# Patient Record
Sex: Female | Born: 1960 | Race: Black or African American | Hispanic: No | Marital: Married | Smoking: Former smoker
Health system: Southern US, Community
[De-identification: ages and names within clinical notes are randomized; demographics above are authoritative.]

---

## 2021-12-29 ENCOUNTER — Emergency Department: Payer: Medicare Other

## 2021-12-29 ENCOUNTER — Other Ambulatory Visit: Payer: Self-pay

## 2021-12-29 ENCOUNTER — Encounter: Payer: Self-pay | Admitting: Emergency Medicine

## 2021-12-29 ENCOUNTER — Emergency Department
Admission: EM | Admit: 2021-12-29 | Discharge: 2021-12-29 | Disposition: A | Discharge status: Home / Self Care | Payer: Medicare Other | Attending: Student in an Organized Health Care Education/Training Program | Admitting: Student in an Organized Health Care Education/Training Program

## 2021-12-29 DIAGNOSIS — E039 Hypothyroidism, unspecified: Secondary | ICD-10-CM | POA: Diagnosis not present

## 2021-12-29 DIAGNOSIS — W228XXA Striking against or struck by other objects, initial encounter: Secondary | ICD-10-CM | POA: Insufficient documentation

## 2021-12-29 DIAGNOSIS — Z23 Encounter for immunization: Secondary | ICD-10-CM | POA: Insufficient documentation

## 2021-12-29 DIAGNOSIS — S81812A Laceration without foreign body, left lower leg, initial encounter: Secondary | ICD-10-CM | POA: Diagnosis not present

## 2021-12-29 DIAGNOSIS — S8992XA Unspecified injury of left lower leg, initial encounter: Secondary | ICD-10-CM | POA: Diagnosis present

## 2021-12-29 MED ORDER — TETANUS-DIPHTH-ACELL PERTUSSIS 5-2.5-18.5 LF-MCG/0.5 IM SUSY
0.5000 mL | PREFILLED_SYRINGE | Freq: Once | INTRAMUSCULAR | Status: AC
  Administered 2021-12-29: 0.5 mL via INTRAMUSCULAR
  Filled 2021-12-29: qty 0.5

## 2021-12-29 MED ORDER — LIDOCAINE-EPINEPHRINE (PF) 2 %-1:200000 IJ SOLN
20.0000 mL | Freq: Once | INTRAMUSCULAR | Status: AC
  Administered 2021-12-29: 20 mL
  Filled 2021-12-29: qty 20

## 2021-12-29 MED ORDER — CEPHALEXIN 500 MG PO CAPS: 500.0000 mg | ORAL_CAPSULE | Freq: Three times a day (TID) | ORAL | 0 refills | Status: AC

## 2021-12-29 MED ORDER — OXYCODONE HCL 5 MG PO TABS
5.0000 mg | ORAL_TABLET | Freq: Once | ORAL | Status: AC
  Administered 2021-12-29: 5 mg via ORAL
  Filled 2021-12-29: qty 1

## 2021-12-29 MED ORDER — BACITRACIN ZINC 500 UNIT/GM EX OINT
TOPICAL_OINTMENT | Freq: Once | CUTANEOUS | Status: AC
  Filled 2021-12-29: qty 1.8

## 2021-12-29 NOTE — Discharge Instructions (Addendum)
-  Take all of antibiotics as prescribed. ?-Take Tylenol/ibuprofen as needed for pain. ?-Return to the emergency department or to your primary care provider for suture removal in 7 to 10 days. ?-Keep the adhesive bandages on for least 48 hours.  After that you may remove them, and wash in the shower with soap and water.  Be careful to avoid generous activity that may undo the sutures. ?-Return to the emergency department anytime if you begin to experience any new or worsening symptoms. ?

## 2021-12-29 NOTE — ED Triage Notes (Signed)
Presents via EMS from [facility] with laceration to left lower leg  per EMS the staff was transferring pt to w/c  and hit her leg  ?

## 2021-12-29 NOTE — ED Provider Notes (Signed)
? ?[facility] ?Provider Note ? ? ? Event Date/Time  ? First MD Initiated Contact with Patient 12/29/21 1311   ?  (approximate) ? ? ?History  ? ?Chief Complaint ?Laceration (/) ? ? ?HPI ?Maria Hubbard is a 61 y.o. female, history of anemia, depression, hypothyroidism, presents to the emergency department for evaluation of leg injury.  Patient states that the patient is being transferred to a different bed when her leg accidentally hit something, causing a large laceration.  She is currently endorsing significant leg pain.  Denies any other injuries.  Denies numbness/tingling in her lower extremities.  No recent illnesses.  Denies fever/chills, knee pain, hip pain, foot pain, chest pain, shortness of breath, lightheadedness, or dizziness. ? ?History Limitations: No limitations. ? ?  ? ? ?Physical Exam  ?Triage Vital Signs: ?ED Triage Vitals  ?Enc Vitals Group  ?   BP 12/29/21 1302 110/85  ?   Pulse Rate 12/29/21 1302 91  ?   Resp 12/29/21 1302 16  ?   Temp 12/29/21 1302 98.4 ?F (36.9 ?C)  ?   Temp Source 12/29/21 1302 Oral  ?   SpO2 12/29/21 1302 98 %  ?   Weight 12/29/21 1303 130 lb (59 kg)  ?   Height 12/29/21 1303 5' 5.5" (1.664 m)  ?   Head Circumference --   ?   Peak Flow --   ?   Pain Score 12/29/21 1303 4  ?   Pain Loc --   ?   Pain Edu? --   ?   Excl. in GC? --   ? ? ?Most recent vital signs: ?Vitals:  ? 12/29/21 1535 12/29/21 1805  ?BP: 122/76 121/73  ?Pulse: 88 80  ?Resp: 16 16  ?Temp:    ?SpO2: 98% 98%  ? ? ?General: Awake, NAD.  ?Skin: Warm, dry.  ?CV: Good peripheral perfusion.  ?Resp: Normal effort.  ?Abd: Soft, non-tender. No distention.  ?Neuro: At baseline. No gross neurological deficits.  ?Other: 15 cm vertical laceration extending from the proximal tibia towards the distal tibia, triangle shaped pattern at the top, as well as a connecting horizontal, 4 centimeter laceration along the proximal tibia.  Normal range of motion of the left lower extremity.  Pulse, motor,  sensation intact.  ? ?Physical Exam ? ? ? ?ED Results / Procedures / Treatments  ?Labs ?(all labs ordered are listed, but only abnormal results are displayed) ?Labs Reviewed - No data to display ? ? ?EKG ?Not applicable. ? ? ?RADIOLOGY ? ?ED Provider Interpretation: I personally reviewed and interpreted this x-ray.  No evidence of fracture or dislocation. ? ?DG Tibia/Fibula Left ? ?Result Date: 12/29/2021 ?CLINICAL DATA:  Trauma, pain EXAM: LEFT TIBIA AND FIBULA - 2 VIEW COMPARISON:  None. FINDINGS: No recent fracture or dislocation is seen. Small smooth marginated calcification adjacent to the tip of medial malleolus may suggest old avulsion. Small bony spurs seen in the left ankle and left knee. There is soft tissue swelling over the ankle more so on the medial side. IMPRESSION: No recent fracture or dislocation is seen in the left tibia and fibula. Electronically Signed   By: Ernie Avena M.D.   On: 12/29/2021 15:04   ? ?PROCEDURES: ? ?Critical Care performed: None. ? ?Marland Kitchen.Laceration Repair ? ?Date/Time: 12/29/2021 8:23 PM ?Performed by: Varney Daily, PA ?Authorized by: Varney Daily, PA  ? ?Consent:  ?  Consent obtained:  Verbal ?  Consent given by:  Patient ?  Risks  discussed:  Infection and pain ?  Alternatives discussed:  No treatment ?Universal protocol:  ?  Patient identity confirmed:  Verbally with patient ?Anesthesia:  ?  Anesthesia method:  Local infiltration ?  Local anesthetic:  Lidocaine 2% WITH epi ?Laceration details:  ?  Location:  Leg ?  Leg location:  L lower leg ?  Length (cm):  15 ?  Depth (mm):  3 ?Pre-procedure details:  ?  Preparation:  Patient was prepped and draped in usual sterile fashion and imaging obtained to evaluate for foreign bodies ?Exploration:  ?  Hemostasis achieved with:  Direct pressure ?  Imaging obtained: x-ray   ?  Imaging outcome: foreign body not noted   ?  Wound exploration: wound explored through full range of motion and entire depth of wound  visualized   ?  Wound extent: no foreign bodies/material noted, no muscle damage noted, no nerve damage noted, no tendon damage noted, no underlying fracture noted and no vascular damage noted   ?Treatment:  ?  Area cleansed with:  Saline ?  Amount of cleaning:  Standard ?  Irrigation solution:  Sterile saline ?  Irrigation volume:  1000 ml ?  Irrigation method:  Pressure wash ?Skin repair:  ?  Repair method:  Sutures ?  Suture size:  4-0 ?  Suture material:  Nylon ?  Suture technique:  Running locked ?  Number of sutures:  6 ?Approximation:  ?  Approximation:  Close ?Post-procedure details:  ?  Dressing:  Sterile dressing, tube gauze, antibiotic ointment and adhesive bandage ?  Procedure completion:  Tolerated well, no immediate complications ? ? ? ?MEDICATIONS ORDERED IN ED: ?Medications  ?oxyCODONE (Oxy IR/ROXICODONE) immediate release tablet 5 mg (5 mg Oral Given 12/29/21 1437)  ?lidocaine-EPINEPHrine (XYLOCAINE W/EPI) 2 %-1:200000 (PF) injection 20 mL (20 mLs Infiltration Given 12/29/21 1437)  ?Tdap (BOOSTRIX) injection 0.5 mL (0.5 mLs Intramuscular Given 12/29/21 1437)  ?bacitracin ointment ( Topical Given 12/29/21 1757)  ? ? ? ?IMPRESSION / MDM / ASSESSMENT AND PLAN / ED COURSE  ?I reviewed the triage vital signs and the nursing notes. ?             ?               ? ?Differential diagnosis includes, but is not limited to, tibia/fibula fracture, laceration ? ?ED Course ?Patient appears well.  Vital signs within normal limits.  Currently endorsing significant pain in her left lower extremity.  We will go ahead treat with small dose of oxycodone. ? ?Laceration was anesthetized with 2% lidocaine with epinephrine.  Cleansed thoroughly with normal saline.  Repaired utilizing 4-0 sutures, 3 running locking, followed by 3 simple interrupted.  Wound was then covered with bacitracin, reinforced by adhesive bandages, and dressed with sterile gauze.  See above for details. ? ?Assessment/Plan ?Patient presents with  laceration to the left lower extremity.  Laceration was repaired successfully with no immediate complications.  X-ray reassuring for no evidence of underlying fracture.  Provided tetanus booster.  We will plan to discharge this patient with a prescription for cephalexin.  Advised her to return the emergency department or see her primary care provider for suture removal in 7 to 10 days. ? ?Patient was provided with anticipatory guidance, return precautions, and educational material. Encouraged the patient to return to the emergency department at any time if they begin to experience any new or worsening symptoms.  ? ?  ? ? ?FINAL CLINICAL IMPRESSION(S) / ED DIAGNOSES  ? ?  Final diagnoses:  ?Laceration of left lower extremity, initial encounter  ? ? ? ?Rx / DC Orders  ? ?ED Discharge Orders   ? ?      Ordered  ?  cephALEXin (KEFLEX) 500 MG capsule  3 times daily       ? 12/29/21 1735  ? ?  ?  ? ?  ? ? ? ?Note:  This document was prepared using Dragon voice recognition software and may include unintentional dictation errors. ?  ?Varney DailySimpson, Madeliene Tejera Lee, [place] ?12/29/21 2028 ? ?  ?Willy Eddyobinson, Patrick, MD ?12/30/21 [phone] ? ?

## 2021-12-29 NOTE — ED Notes (Signed)
Left lower leg wound cleaned and dressed with bactracin and conform  tolerated well ?

## 2023-02-24 IMAGING — DX DG TIBIA/FIBULA 2V*L*
3 series · 3 of 3 positions shown · non-contrast
Comparison: None.

CLINICAL DATA: Trauma, pain

EXAM:
LEFT TIBIA AND FIBULA - 2 VIEW

[tibia ap (1 of 2)]
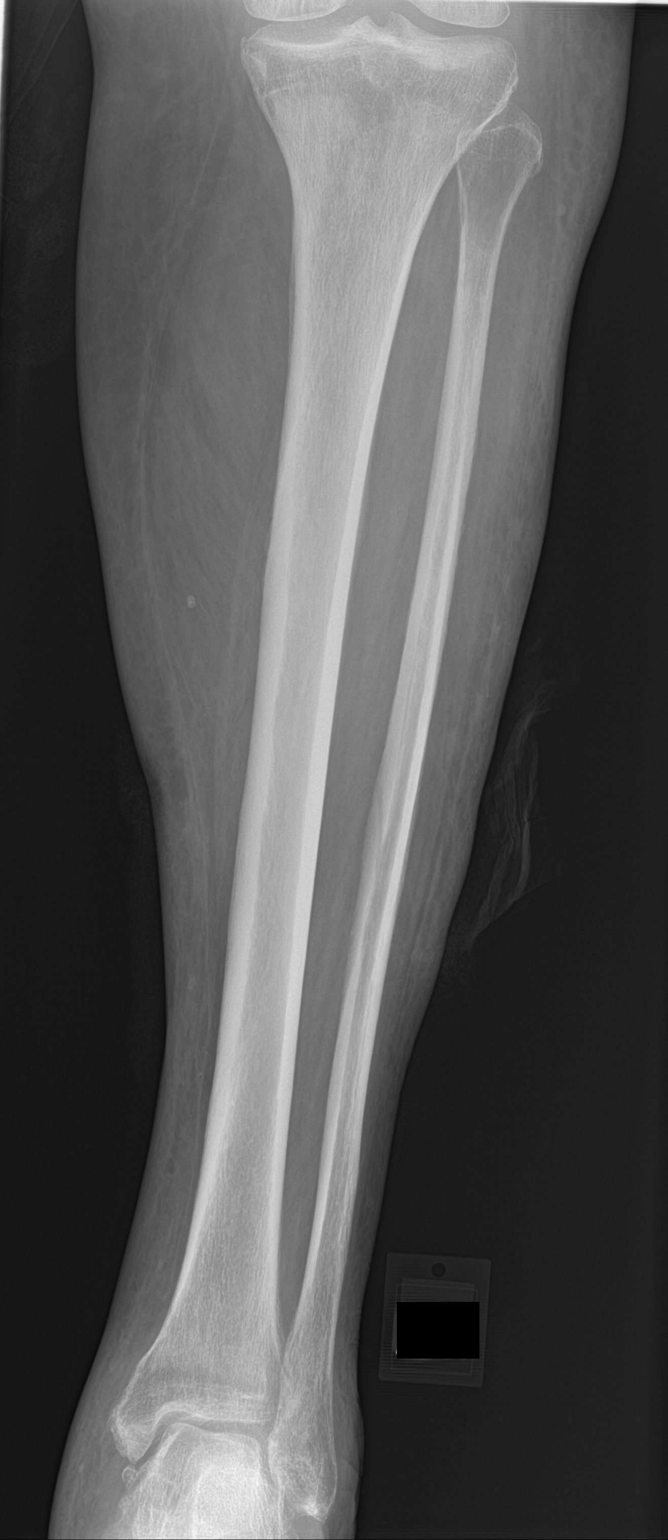

[tibia ap (2 of 2)]
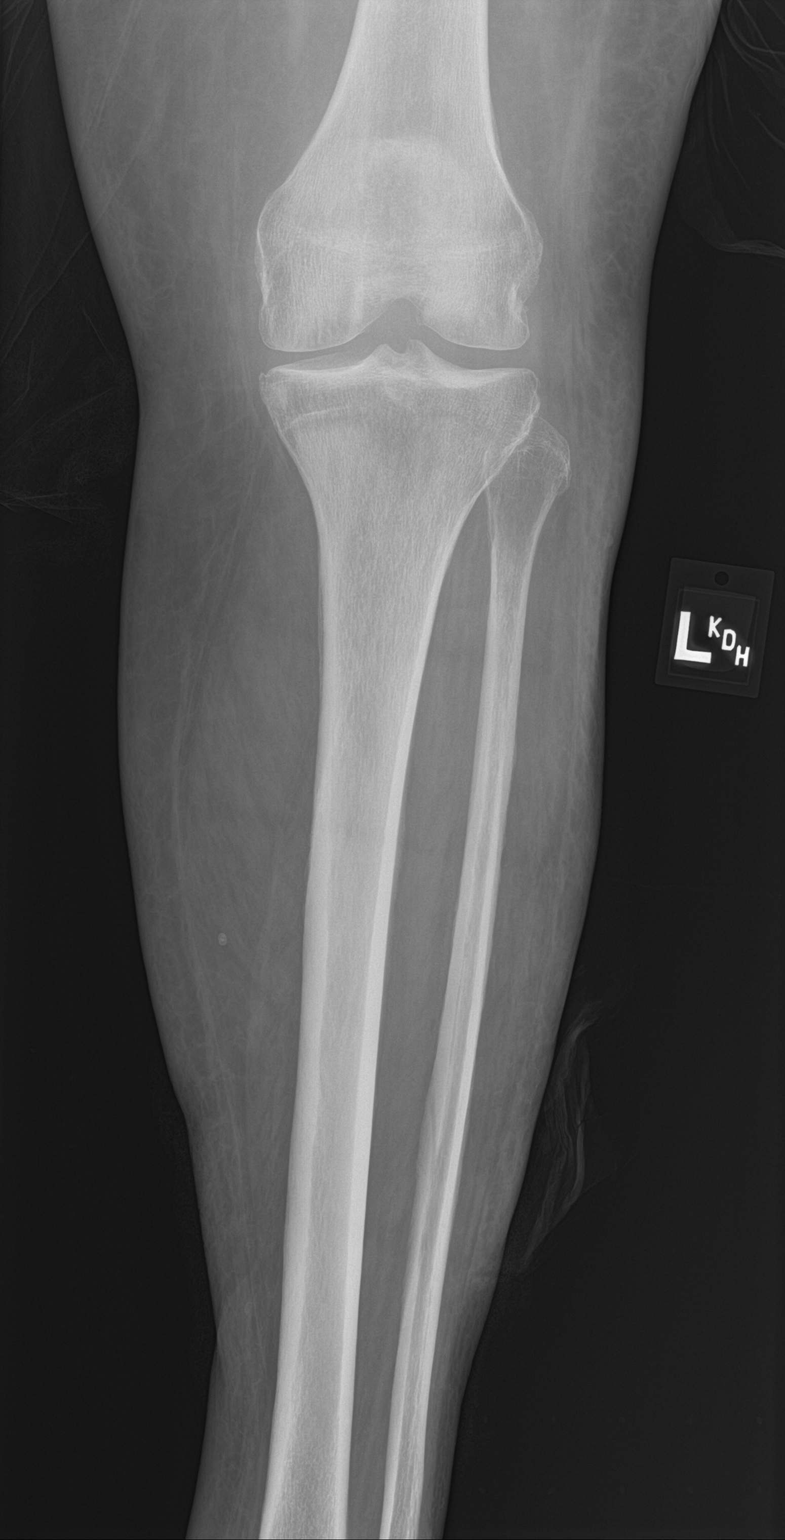

[tibia lat]
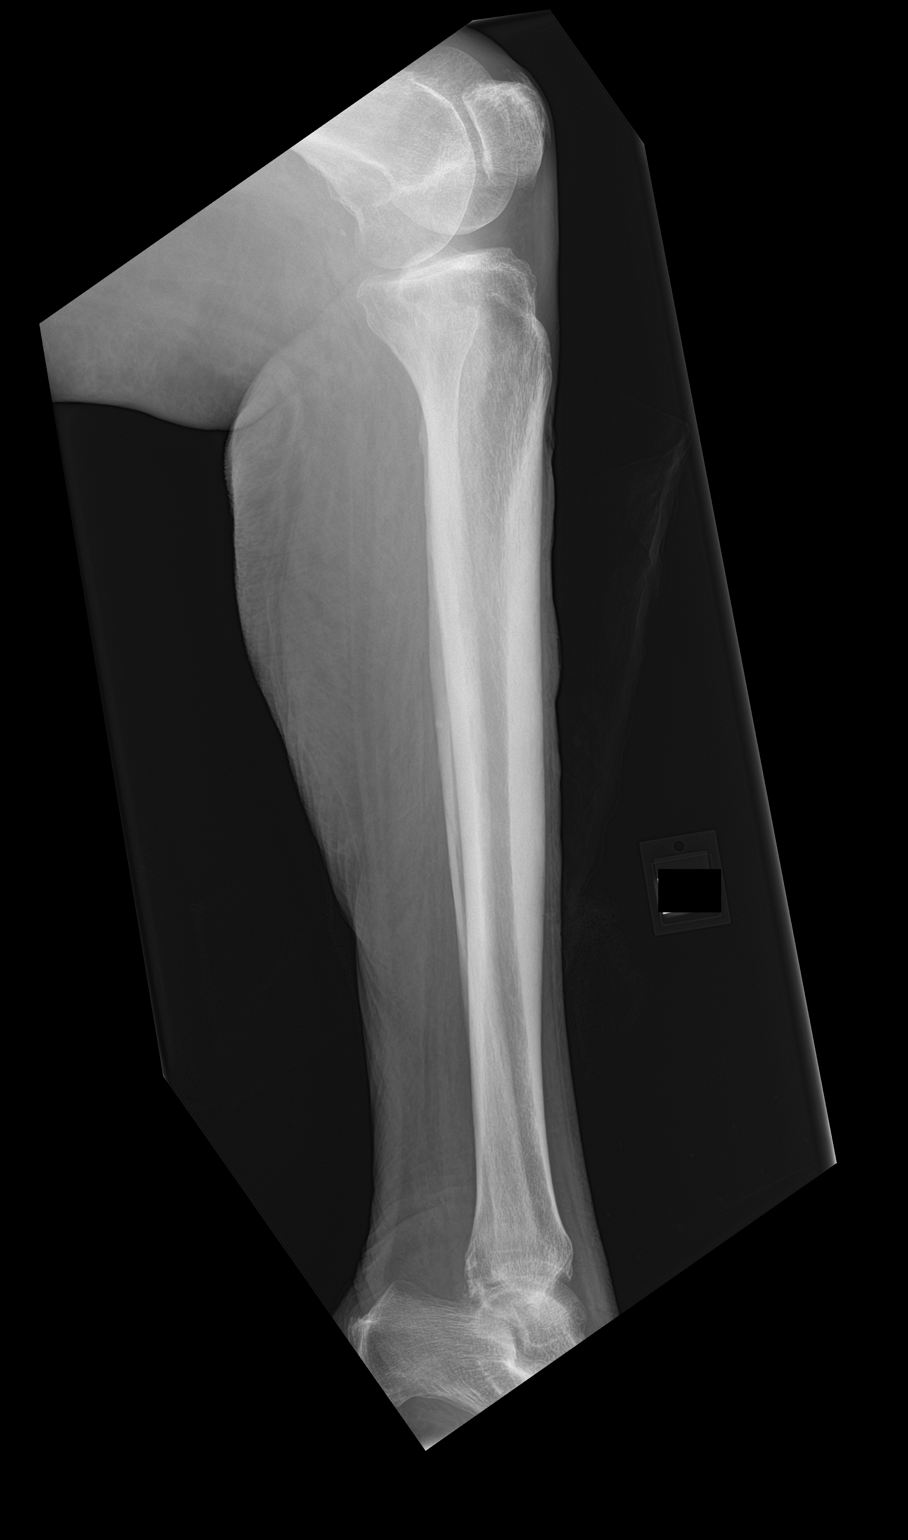

[3 of 3 positions shown; findings below may reference images not displayed]

FINDINGS: No recent fracture or dislocation is seen. Small smooth marginated
calcification adjacent to the tip of medial malleolus may suggest
old avulsion. Small bony spurs seen in the left ankle and left knee.
There is soft tissue swelling over the ankle more so on the medial
side.
IMPRESSION: No recent fracture or dislocation is seen in the left tibia and
fibula.

## 2023-05-17 DEATH — deceased
